# Patient Record
Sex: Female | Born: 1955 | Race: White | Hispanic: No | Marital: Single | Smoking: Former smoker
Health system: Southern US, Community
[De-identification: ages and names within clinical notes are randomized; demographics above are authoritative.]

## PROBLEM LIST (undated history)

## (undated) DIAGNOSIS — G4700 Insomnia, unspecified: Secondary | ICD-10-CM

## (undated) DIAGNOSIS — L299 Pruritus, unspecified: Secondary | ICD-10-CM

## (undated) DIAGNOSIS — F419 Anxiety disorder, unspecified: Secondary | ICD-10-CM

## (undated) HISTORY — PX: TONSILLECTOMY: SUR1361

---

## 1998-09-28 ENCOUNTER — Emergency Department (HOSPITAL_COMMUNITY): Admission: EM | Admit: 1998-09-28 | Discharge: 1998-09-28 | Payer: Self-pay | Admitting: Internal Medicine

## 2000-01-02 ENCOUNTER — Emergency Department (HOSPITAL_COMMUNITY): Admission: EM | Admit: 2000-01-02 | Discharge: 2000-01-02 | Payer: Self-pay | Admitting: Emergency Medicine

## 2000-01-02 ENCOUNTER — Encounter: Payer: Self-pay | Admitting: Emergency Medicine

## 2014-07-04 ENCOUNTER — Emergency Department (HOSPITAL_BASED_OUTPATIENT_CLINIC_OR_DEPARTMENT_OTHER)
Admission: EM | Admit: 2014-07-04 | Discharge: 2014-07-04 | Disposition: A | Discharge status: Home / Self Care | Payer: BC Managed Care – PPO | Attending: Emergency Medicine | Admitting: Emergency Medicine

## 2014-07-04 ENCOUNTER — Encounter (HOSPITAL_BASED_OUTPATIENT_CLINIC_OR_DEPARTMENT_OTHER): Payer: Self-pay | Admitting: Emergency Medicine

## 2014-07-04 ENCOUNTER — Emergency Department (HOSPITAL_BASED_OUTPATIENT_CLINIC_OR_DEPARTMENT_OTHER): Payer: BC Managed Care – PPO

## 2014-07-04 DIAGNOSIS — Z79899 Other long term (current) drug therapy: Secondary | ICD-10-CM | POA: Insufficient documentation

## 2014-07-04 DIAGNOSIS — IMO0002 Reserved for concepts with insufficient information to code with codable children: Secondary | ICD-10-CM | POA: Insufficient documentation

## 2014-07-04 DIAGNOSIS — F411 Generalized anxiety disorder: Secondary | ICD-10-CM | POA: Insufficient documentation

## 2014-07-04 DIAGNOSIS — Y92410 Unspecified street and highway as the place of occurrence of the external cause: Secondary | ICD-10-CM | POA: Insufficient documentation

## 2014-07-04 DIAGNOSIS — T148XXA Other injury of unspecified body region, initial encounter: Secondary | ICD-10-CM

## 2014-07-04 DIAGNOSIS — S40019A Contusion of unspecified shoulder, initial encounter: Secondary | ICD-10-CM | POA: Insufficient documentation

## 2014-07-04 DIAGNOSIS — Z872 Personal history of diseases of the skin and subcutaneous tissue: Secondary | ICD-10-CM | POA: Insufficient documentation

## 2014-07-04 DIAGNOSIS — S300XXA Contusion of lower back and pelvis, initial encounter: Secondary | ICD-10-CM | POA: Insufficient documentation

## 2014-07-04 DIAGNOSIS — Y9389 Activity, other specified: Secondary | ICD-10-CM | POA: Insufficient documentation

## 2014-07-04 HISTORY — DX: Anxiety disorder, unspecified: F41.9

## 2014-07-04 HISTORY — DX: Pruritus, unspecified: L29.9

## 2014-07-04 HISTORY — DX: Insomnia, unspecified: G47.00

## 2014-07-04 MED ORDER — IBUPROFEN 600 MG PO TABS: 600.0000 mg | ORAL_TABLET | Freq: Four times a day (QID) | ORAL | Status: AC | PRN

## 2014-07-04 MED ORDER — HYDROCODONE-ACETAMINOPHEN 5-325 MG PO TABS
1.0000 | ORAL_TABLET | Freq: Four times a day (QID) | ORAL | Status: AC | PRN
Start: 2014-07-04 — End: ?
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
## 2014-07-04 NOTE — ED Notes (Signed)
Patient transported to X-ray 
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
